# Patient Record
Sex: Male | Born: 2000 | Race: White | Hispanic: No | Marital: Single | Smoking: Never smoker
Health system: Southern US, Community
[De-identification: ages and names within clinical notes are randomized; demographics above are authoritative.]

## PROBLEM LIST (undated history)

## (undated) DIAGNOSIS — J302 Other seasonal allergic rhinitis: Secondary | ICD-10-CM

## (undated) HISTORY — DX: Other seasonal allergic rhinitis: J30.2

---

## 2021-02-10 ENCOUNTER — Other Ambulatory Visit: Payer: Self-pay

## 2021-02-10 ENCOUNTER — Other Ambulatory Visit: Payer: Self-pay | Admitting: Family Medicine

## 2021-02-10 ENCOUNTER — Ambulatory Visit
Admission: RE | Admit: 2021-02-10 | Discharge: 2021-02-10 | Disposition: A | Discharge status: Home / Self Care | Payer: BC Managed Care – PPO | Source: Ambulatory Visit | Attending: Family Medicine | Admitting: Family Medicine

## 2021-02-10 DIAGNOSIS — R0602 Shortness of breath: Secondary | ICD-10-CM

## 2021-02-10 DIAGNOSIS — R059 Cough, unspecified: Secondary | ICD-10-CM | POA: Diagnosis present

## 2022-09-09 IMAGING — CR DG CHEST 2V
1 series · 2 of 2 positions shown · non-contrast
Comparison: None.

CLINICAL DATA: Cough, shortness of breath

EXAM:
CHEST - 2 VIEW

[Series 1: dg chest 2 view · 0.14mm/px · 2 of 2 slices shown]
[im 1/2]
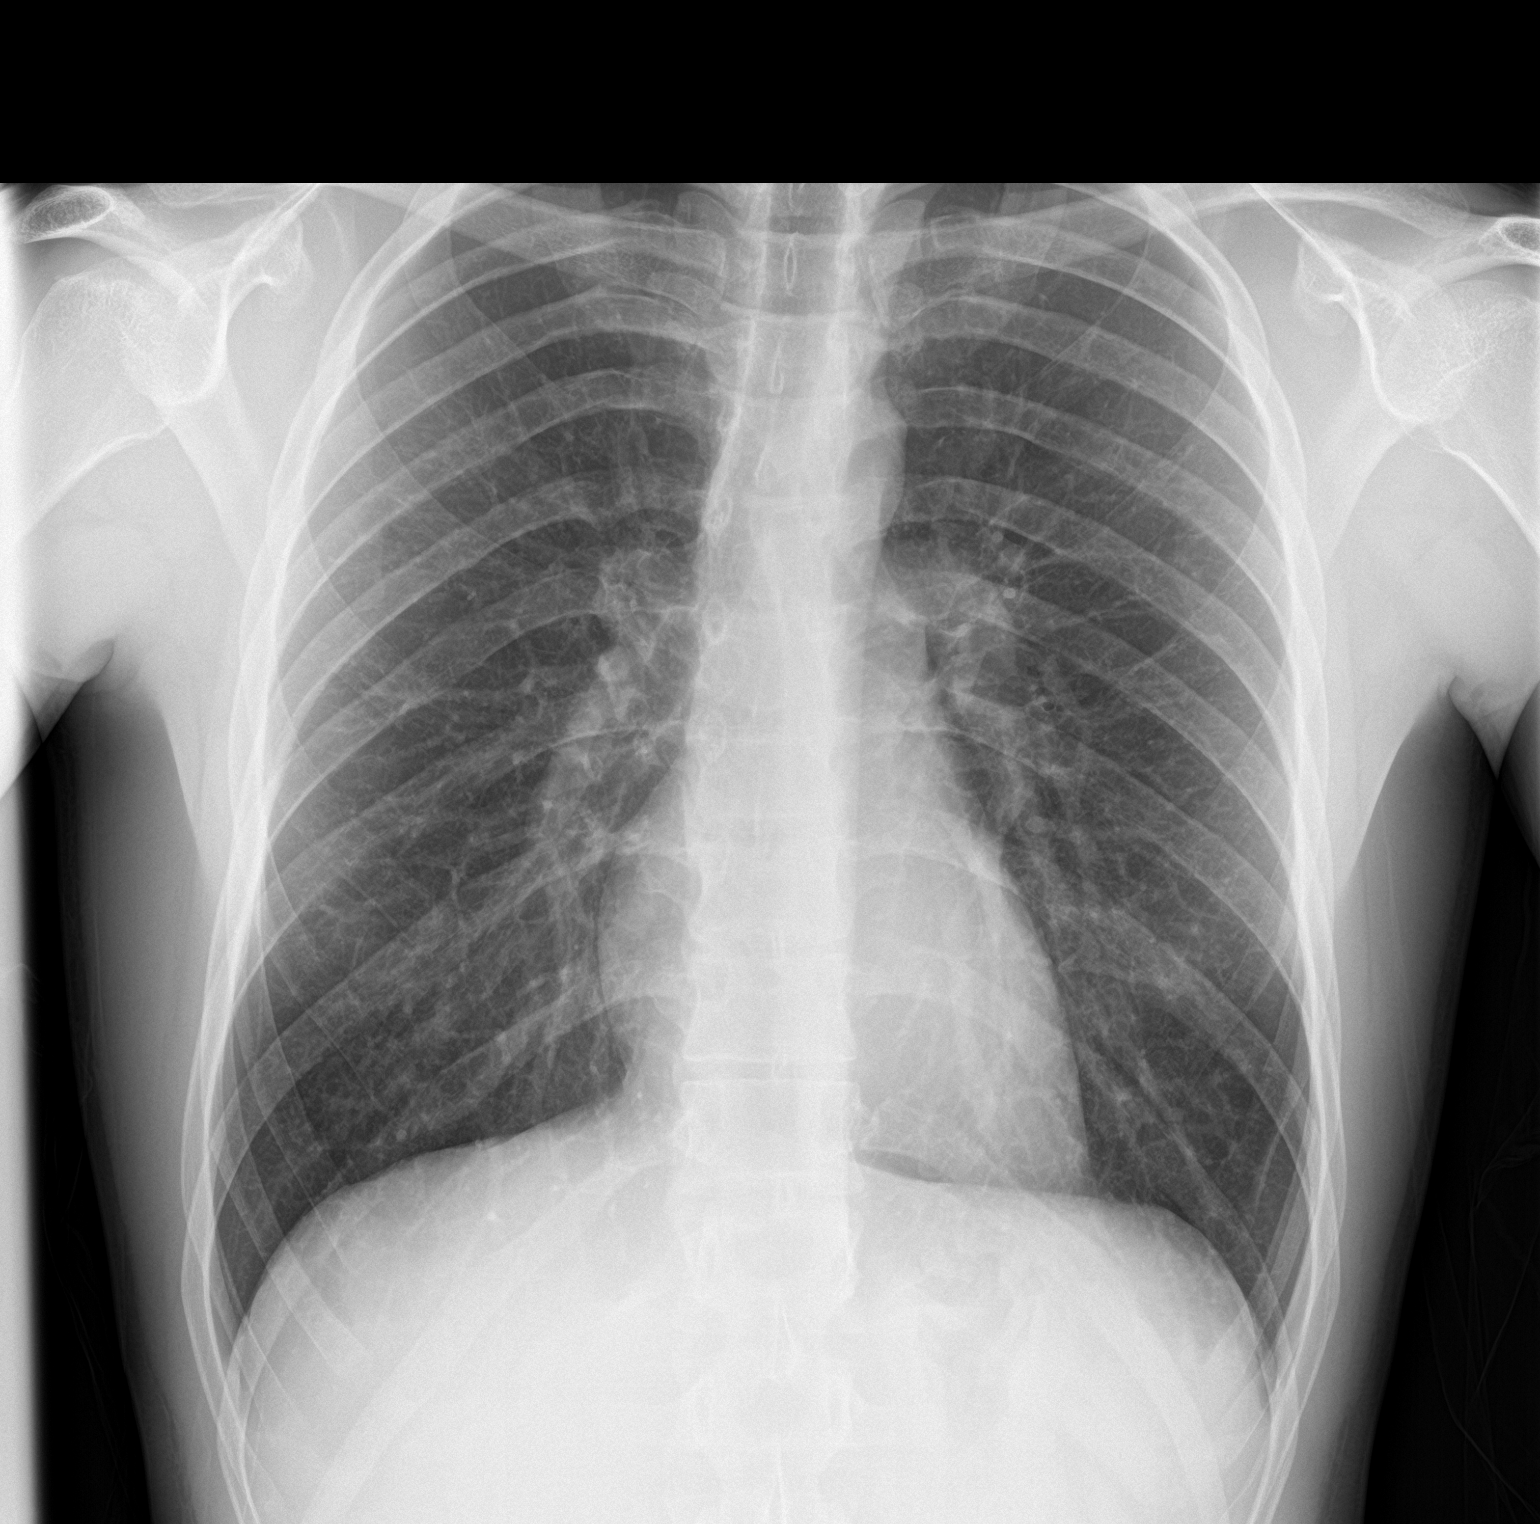
[im 2/2]
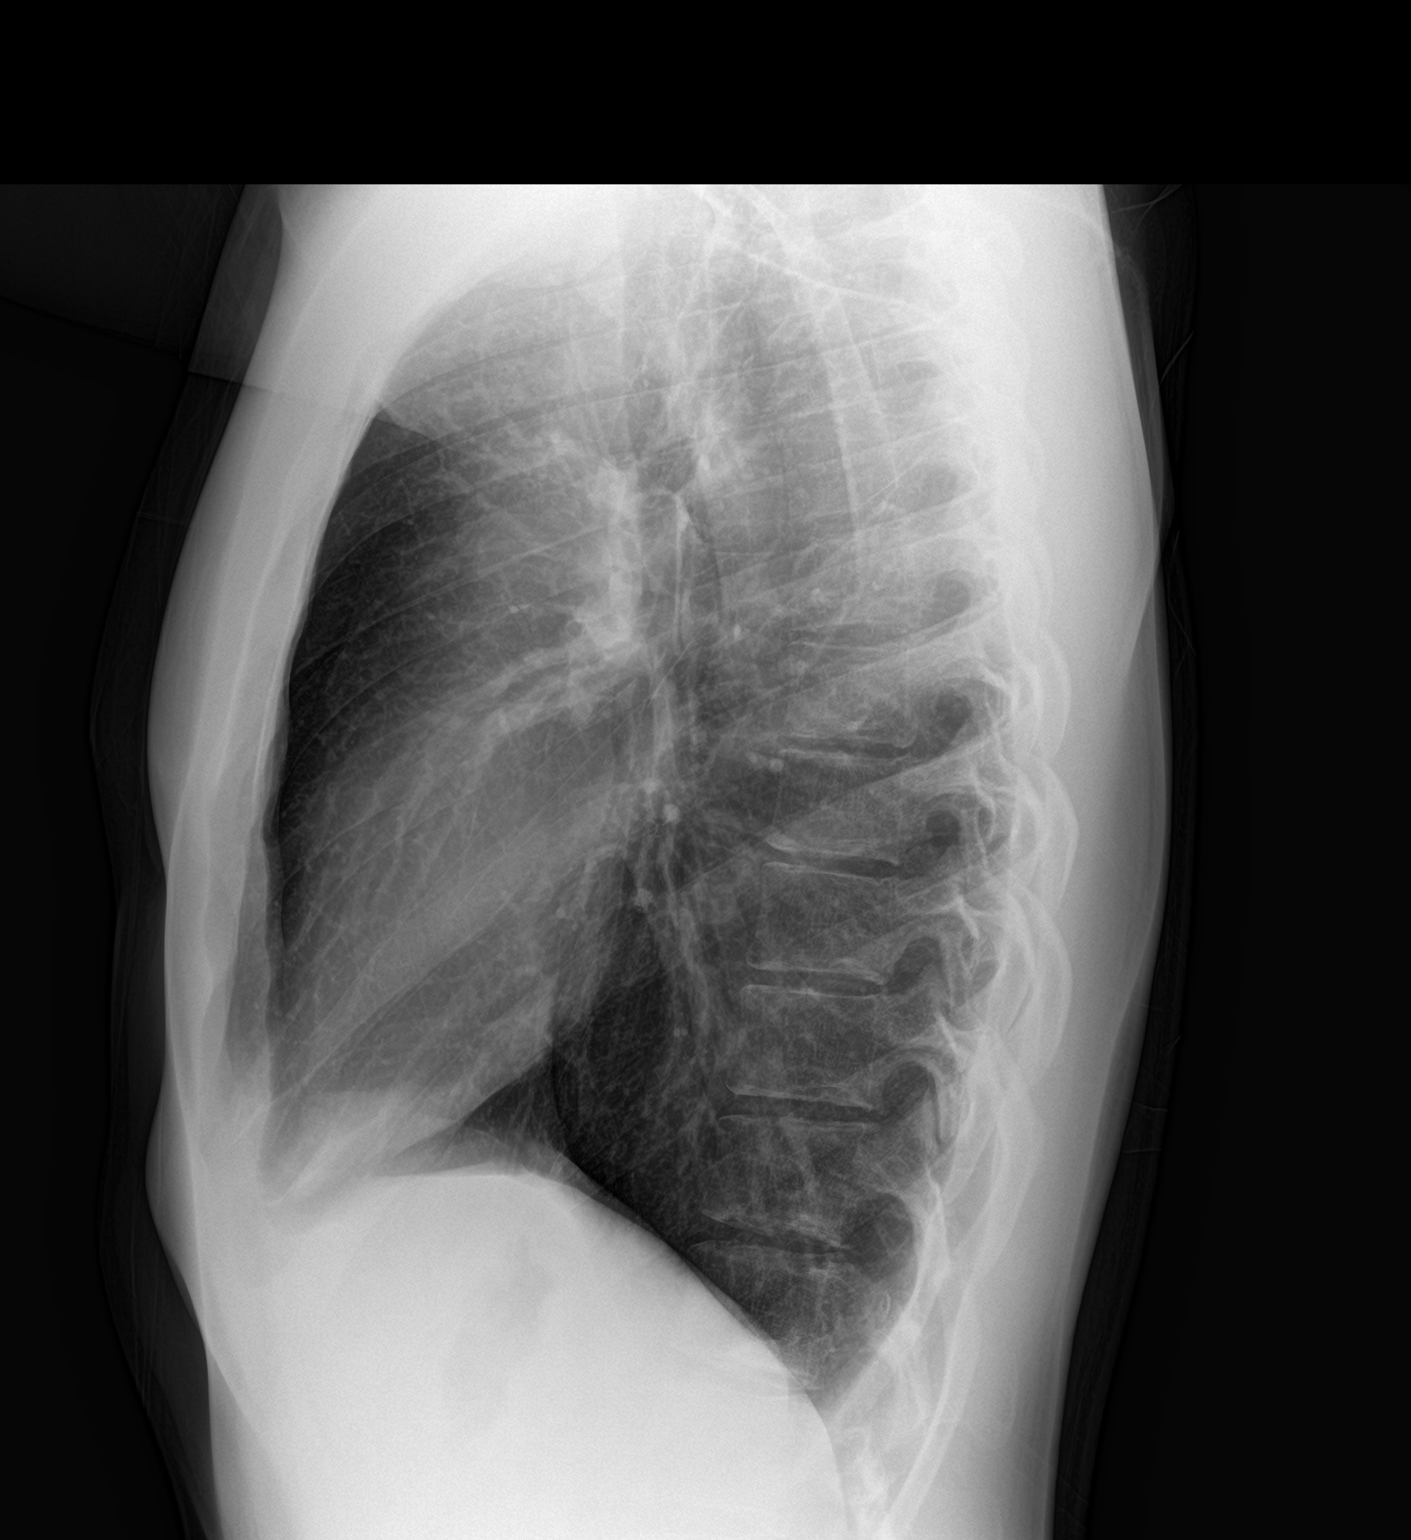

[2 of 2 positions shown; findings below may reference images not displayed]

FINDINGS: Slight hyperinflation. No consolidation, features of edema,
pneumothorax, or effusion. Pulmonary vascularity is normally
distributed. The cardiomediastinal contours are unremarkable. No
acute osseous or soft tissue abnormality.
IMPRESSION: Mild hyperinflation without other acute cardiopulmonary abnormality.

## 2023-02-08 ENCOUNTER — Other Ambulatory Visit: Payer: Self-pay

## 2023-02-08 ENCOUNTER — Encounter: Payer: Self-pay | Admitting: Medical

## 2023-02-08 ENCOUNTER — Ambulatory Visit (INDEPENDENT_AMBULATORY_CARE_PROVIDER_SITE_OTHER): Payer: BC Managed Care – PPO | Admitting: Medical

## 2023-02-08 VITALS — BP 140/72 | HR 81 | Temp 95.0°F | Ht 72.05 in | Wt 186.0 lb

## 2023-02-08 DIAGNOSIS — R052 Subacute cough: Secondary | ICD-10-CM

## 2023-02-08 DIAGNOSIS — J329 Chronic sinusitis, unspecified: Secondary | ICD-10-CM | POA: Diagnosis not present

## 2023-02-08 DIAGNOSIS — J302 Other seasonal allergic rhinitis: Secondary | ICD-10-CM

## 2023-02-08 MED ORDER — AZELASTINE HCL 0.1 % NA SOLN: 1.0000 | Freq: Two times a day (BID) | NASAL | 0 refills | Status: DC

## 2023-02-08 MED ORDER — AMOXICILLIN-POT CLAVULANATE 875-125 MG PO TABS: 1.0000 | ORAL_TABLET | Freq: Two times a day (BID) | ORAL | 0 refills | Status: DC

## 2023-02-08 MED ORDER — ALBUTEROL SULFATE HFA 108 (90 BASE) MCG/ACT IN AERS: 1.0000 | INHALATION_SPRAY | RESPIRATORY_TRACT | 0 refills | Status: DC | PRN

## 2023-02-08 NOTE — Patient Instructions (Addendum)
-  Start Azelastine nasal spray, 1 spray to each nostril twice daily. -Continue Flonase/Fluticasone nasal spray, 2 sprays to each nostril once a day. -Continue Xyzal once daily. -Use albuterol inhaler if needed for shortness of breath, wheezing, chest tightness or persistent cough. -Rest and stay well hydrated (by drinking water and other liquids).  -Continue Sudafed or Mucinex DM if needed. -Fill antibiotic to hold for now. May stay this if symptoms worsen (i.e. persistent sinus pain, fever) -Send MyChart message to provider or schedule return visit as needed for new/worsening symptoms or if symptoms do not improve as discussed with recommended treatment.   -Schedule appointment with ENT for June to discuss frequent sinus infections.

## 2023-02-08 NOTE — Progress Notes (Signed)
[place] [place], [place] [address] Phone: [phone] Fax: [phone]   Office Visit Note  Patient Name: Parker Jones  Date of [id number]  Med Rec number [id number]  Date of Service: 02/08/2023  Allergies: Patient has no known allergies.  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   Has been on 3 antibiotics since returned in January for sinusitis/bronchitis. Was in [place] for fall semester. Most recent antibiotic was a Zpack, was prescribed about a month ago. Had only minor improvement with this. Tends to get sinus infections in spring since coming to [facility] for school, occasionally has sinusitis other times of year. Started Xyzal last week. Has been using Navage twice a day since January using purified water. D/C using Navage has been green last 2 weeks. Has used Flonase since beginning of January. Has not seen ENT recently. Has not had allergy testing.   Has used Mucinex DM this week, Sudafed last week, some helpful.   Has had cough last 2 weeks, productive of green mucus. States it is not unusual for him to have a lot of mucus. It is generally a little hard to take a deep breath. Nasal congestion for at least last 2 weeks. Still has mostly green nasal d/c. Currently does not have sinus pain, but did have this up until this past weekend. Sometimes has HA, not currently  Does sleep well. Cough is not keeping him up. Some chills last week, not for over a week.   Going to [facility] for spring break. Smoking marijuana, less in last 2 weeks. No vaping.    Current Medication:  Outpatient Encounter Medications as of 02/08/2023  Medication Sig   fluticasone (FLONASE) 50 MCG/ACT nasal spray 1 spray in each nostril Nasally Twice a day for 30 day(s)   levocetirizine (XYZAL) 5 MG tablet Take 5 mg by mouth every evening.   No facility-administered encounter medications on file as of  02/08/2023.      Medical History: Seasonal allergies Recurrent sinus infections   Vital Signs: BP (!) 140/72   Pulse 81   Temp (!) 95 F (35 C) (Tympanic)   Ht 6' 0.05" (1.83 m)   Wt 186 lb (84.4 kg)   SpO2 98%   BMI 25.19 kg/m    Review of Systems See HPI  Physical Exam Vitals reviewed.  Constitutional:      General: He is not in acute distress.    Appearance: He is not ill-appearing.  HENT:     Head: Normocephalic.     Right Ear: External ear normal.     Left Ear: External ear normal.     Ears:     Comments: Limited view of TMs due to dry cerumen to EACs. Visible portion of TMs not erythematous.    Nose: Mucosal edema (R>L), congestion and rhinorrhea present. Rhinorrhea is clear.     Right Turbinates: Swollen.     Left Turbinates: Swollen.     Right Sinus: No maxillary sinus tenderness or frontal sinus tenderness.     Left Sinus: No maxillary sinus tenderness or frontal sinus tenderness.     Mouth/Throat:     Mouth: Mucous membranes are moist. No oral lesions.     Pharynx: No pharyngeal swelling or posterior oropharyngeal erythema.     Tonsils: No tonsillar exudate. 0 on the right. 0 on the left.  Cardiovascular:     Rate and Rhythm:  Normal rate and regular rhythm.     Heart sounds: No murmur heard.    No friction rub. No gallop.  Pulmonary:     Effort: Pulmonary effort is normal.     Breath sounds: Normal breath sounds. No wheezing, rhonchi or rales.     Comments: Breath sounds slightly distant/diminished in bases bilaterally. Musculoskeletal:     Cervical back: Neck supple. No rigidity.  Lymphadenopathy:     Cervical: Cervical adenopathy (1+ anterior nodes, mildly tender) present.  Neurological:     Mental Status: He is alert.     Assessment/Plan: 1. Recurrent sinusitis 2. Subacute cough 3. Seasonal allergic rhinitis, unspecified trigger Suspect inadequately managed allergies likely contributing to recurrent sinusitis. Will start Azelastine nasal  spray to use in addition to Flonase and Xyzal. Continue Navage twice daily. May continue decongestant as needed. Given upcoming travel for spring break, will give antibiotic to hold if develops new/worsening symptoms (i.e. persistent sinus pain/pressure, fever). Gave albuterol inhaler to use as needed, has been helpful in past. Patient encouraged to schedule appointment with ENT for return home in June to discuss frequent sinus infections. Send MyChart message to provider or schedule return visit in meantime as needed for new/worsening symptoms (i.e. fever, increased shortness of breath, chest pain).  - albuterol (VENTOLIN HFA) 108 (90 Base) MCG/ACT inhaler; Inhale 1-2 puffs into the lungs every 4 (four) hours as needed for wheezing or shortness of breath (or cough).  Dispense: 1 each; Refill: 0 - amoxicillin-clavulanate (AUGMENTIN) 875-125 MG tablet; Take 1 tablet by mouth 2 (two) times daily.  Dispense: 20 tablet; Refill: 0 - azelastine (ASTELIN) 0.1 % nasal spray; Place 1 spray into both nostrils 2 (two) times daily. Use in each nostril as directed  Dispense: 30 mL; Refill: 0     General Counseling: Bettie verbalizes understanding of the findings of todays visit and agrees with plan of treatment. he has been encouraged to call the office with any questions or concerns that should arise related to todays visit.    Time spent:20 [place] PA-C [place] 02/08/2023 8:36 AM

## 2023-02-21 ENCOUNTER — Encounter: Payer: Self-pay | Admitting: Medical

## 2023-02-21 ENCOUNTER — Other Ambulatory Visit: Payer: Self-pay

## 2023-02-21 ENCOUNTER — Ambulatory Visit (INDEPENDENT_AMBULATORY_CARE_PROVIDER_SITE_OTHER): Payer: BC Managed Care – PPO | Admitting: Medical

## 2023-02-21 VITALS — BP 121/81 | HR 90 | Temp 95.7°F | Ht 72.05 in | Wt 186.0 lb

## 2023-02-21 DIAGNOSIS — Z7184 Encounter for health counseling related to travel: Secondary | ICD-10-CM | POA: Diagnosis not present

## 2023-02-21 DIAGNOSIS — Z029 Encounter for administrative examinations, unspecified: Secondary | ICD-10-CM

## 2023-02-21 MED ORDER — TYPHOID VACCINE PO CPDR: 1.0000 | DELAYED_RELEASE_CAPSULE | ORAL | 0 refills | Status: DC

## 2023-02-21 NOTE — Progress Notes (Unsigned)
[place] [place], [place] [address] Phone: [phone] Fax: [phone]   Office Visit Note  Patient Name: Parker Jones  Date of [id number]  Med Rec number [id number]  Date of Service: 02/21/2023  Allergies: Patient has no known allergies.  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 Has studied abroad in [place] and [place] before.  Last Tdap on file was 10/2012. Did receive 2 vaccines at last physical, unsure what they were.   Has seasonal allergies. Takes Xyzal, Flonase and Astelin. Does not have asthma, but uses albuterol about once a day during allergy season.  Denies hospitalizations or surgeries. Denies hx of serious injuries.    Current Medication:  Outpatient Encounter Medications as of 02/21/2023  Medication Sig   albuterol (VENTOLIN HFA) 108 (90 Base) MCG/ACT inhaler Inhale 1-2 puffs into the lungs every 4 (four) hours as needed for wheezing or shortness of breath (or cough).   azelastine (ASTELIN) 0.1 % nasal spray Place 1 spray into both nostrils 2 (two) times daily. Use in each nostril as directed   fluticasone (FLONASE) 50 MCG/ACT nasal spray 1 spray in each nostril Nasally Twice a day for 30 day(s)   levocetirizine (XYZAL) 5 MG tablet Take 5 mg by mouth every evening.   [DISCONTINUED] amoxicillin-clavulanate (AUGMENTIN) 875-125 MG tablet Take 1 tablet by mouth 2 (two) times daily. (Patient not taking: Reported on 02/21/2023)   No facility-administered encounter medications on file as of 02/21/2023.      Medical History: Past Medical History:  Diagnosis Date   Seasonal allergies      Vital Signs: BP 121/81   Pulse 90   Temp (!) 95.7 F (35.4 C) (Tympanic)   Ht 6' 0.05" (1.83 m)   Wt 186 lb (84.4 kg)   SpO2 99%   BMI 25.19 kg/m    Review of Systems  Physical Exam Vitals reviewed.   Constitutional:      General: He is not in acute distress.    Appearance: He is not ill-appearing.  HENT:     Head: Normocephalic and atraumatic.     Right Ear: External ear normal.     Left Ear: External ear normal.     Ears:     Comments: Moderate dry cerumen to EACs bilaterally. Visible portion of TMs normal.    Nose: No congestion or rhinorrhea.     Mouth/Throat:     Mouth: Mucous membranes are moist. No oral lesions.     Dentition: Normal dentition.     Pharynx: No pharyngeal swelling or posterior oropharyngeal erythema.     Tonsils: No tonsillar exudate. 0 on the right. 0 on the left.  Eyes:     General: Lids are normal.     Conjunctiva/sclera: Conjunctivae normal.     Pupils: Pupils are equal, round, and reactive to light.  Neck:     Thyroid: No thyroid mass, thyromegaly or thyroid tenderness.  Cardiovascular:     Rate and Rhythm: Normal rate and regular rhythm.     Heart sounds: No murmur heard.    No friction rub. No gallop.  Pulmonary:     Effort: Pulmonary effort is normal.     Breath sounds: Normal breath sounds. No wheezing, rhonchi or rales.  Abdominal:     General: There is no distension.  Palpations: Abdomen is soft. There is no mass.     Tenderness: There is no abdominal tenderness. There is no guarding.  Musculoskeletal:        General: No deformity or signs of injury.     Cervical back: Neck supple. No rigidity.     Right lower leg: No edema.     Left lower leg: No edema.     Comments: AROM grossly intact, symmetric and painless to bilateral UE, LE, lumbar and cervical spine  Lymphadenopathy:     Cervical: No cervical adenopathy.  Neurological:     Mental Status: He is alert and oriented to person, place, and time.     Cranial Nerves: Cranial nerves 2-12 are intact.     Gait: Gait and tandem walk normal.  Psychiatric:        Attention and Perception: Attention normal.        Mood and Affect: Mood and affect normal.        Behavior: Behavior  normal. Behavior is cooperative.       Assessment/Plan: 1. Encounter for administrative examinations, unspecified *** - typhoid (VIVOTIF) DR capsule; Take 1 capsule by mouth every other day.  Dispense: 4 capsule; Refill: 0  2. Travel advice encounter *** - typhoid (VIVOTIF) DR capsule; Take 1 capsule by mouth every other day.  Dispense: 4 capsule; Refill: 0  Patient will return later in week for Tdap, short on time today. Will return form to him at that time.  [facility] [address] for Typhoid rx  General Counseling: Parker Jones verbalizes understanding of the findings of todays visit and agrees with plan of treatment. he has been encouraged to call the office with any questions or concerns that should arise related to todays visit.   No orders of the defined types were placed in this encounter.   No orders of the defined types were placed in this encounter.   Time spent:*** Minutes    Billey Gosling PA-C [place] 02/21/2023 9:10 AM

## 2023-02-23 ENCOUNTER — Encounter: Payer: Self-pay | Admitting: Medical

## 2023-02-23 NOTE — Patient Instructions (Signed)
-  I recommend you stop smoking marijuana.  This should help improve your lung function, which will in turn make it easier to fully participate in your trip. -Make sure to bring sufficient supplies of your Xyzal, Flonase and azelastine with you on your trip. -Bring a full albuterol inhaler on your trip to use as needed while you are hiking. -Return later this week for a tetanus vaccine.

## 2023-02-24 ENCOUNTER — Ambulatory Visit (INDEPENDENT_AMBULATORY_CARE_PROVIDER_SITE_OTHER): Payer: BC Managed Care – PPO | Admitting: Medical

## 2023-02-24 ENCOUNTER — Other Ambulatory Visit: Payer: Self-pay

## 2023-02-24 ENCOUNTER — Encounter: Payer: Self-pay | Admitting: Medical

## 2023-02-24 VITALS — BP 103/68 | HR 88 | Temp 96.4°F | Wt 186.0 lb

## 2023-02-24 DIAGNOSIS — Z23 Encounter for immunization: Secondary | ICD-10-CM

## 2023-02-24 DIAGNOSIS — Z7184 Encounter for health counseling related to travel: Secondary | ICD-10-CM

## 2023-02-24 NOTE — Progress Notes (Signed)
[name] [place], [place] [address] Phone: [phone] Fax: [phone]   Office Visit Note  Patient Name: [place] Jones  Date of [id number]  Med Rec number [id number]  Date of Service: 02/24/2023  Allergies: Patient has no known allergies.  Chief Complaint  Patient presents with   Follow-up     HPI 22 YO male presents for follow up from recent physical and for vaccine.  See previous note. Here today for tetanus booster, prefers Td over Tdap.   Need additional form ("Lifesaving Medication Form") from patient to complete since he is using albuterol inhaler. He denies previous asthma dx but has experienced cough, wheezing and some shortness of breath responsive to albuterol during spring allergy season last few years. Denies having these symptoms in childhood or requiring an inhaler.  He does smoke marijuana regularly.   Has not yet picked up Typhoid vaccine from pharmacy.   Current Medication:  Outpatient Encounter Medications as of 02/24/2023  Medication Sig   albuterol (VENTOLIN HFA) 108 (90 Base) MCG/ACT inhaler Inhale 1-2 puffs into the lungs every 4 (four) hours as needed for wheezing or shortness of breath (or cough).   azelastine (ASTELIN) 0.1 % nasal spray Place 1 spray into both nostrils 2 (two) times daily. Use in each nostril as directed   fluticasone (FLONASE) 50 MCG/ACT nasal spray 1 spray in each nostril Nasally Twice a day for 30 day(s)   levocetirizine (XYZAL) 5 MG tablet Take 5 mg by mouth every evening.   typhoid (VIVOTIF) DR capsule Take 1 capsule by mouth every other day.   No facility-administered encounter medications on file as of 02/24/2023.      Medical History: Past Medical History:  Diagnosis Date   Seasonal allergies      Vital Signs: BP 103/68   Pulse 88   Temp (!) 96.4 F (35.8 C) (Tympanic)   Wt 186 lb (84.4 kg)   SpO2 98%   BMI 25.19 kg/m    Review of Systems  Constitutional: Negative.      Physical Exam Vitals reviewed.  Constitutional:      General: He is not in acute distress.    Appearance: He is not ill-appearing.  Neurological:     Mental Status: He is alert.       Assessment/Plan: 1. Need for prophylactic vaccination with tetanus-diphtheria (Td) Td booster administered today.   - Td : Tetanus/diphtheria >7yo Preservative  free  2. Travel advice encounter Patient will email me additional form for completion. Reiterated that he will need to bring sufficient supplies or Azelastine, Flonase, Xyzal and albuterol with him on trip. Discussed that smoking marijuana is likely contributing to cough/shortness of breath and that stopping would likely allow him to enjoy trip (which is very physically-demanding) more fully. Patient encouraged to call or go to pharmacy to see if Typhoid vaccine is ready. Needs to be completed at least 2 weeks before departure to ensure immunity benefit.   Once I receive additional form, I will complete it and leave paperwork for him to pick up from front desk. Will notify him via MyChart when form is ready.     General Counseling: juarez spaziani understanding of the findings of todays visit and agrees with plan of treatment. he has been encouraged to call the office with any questions or concerns that should arise related to todays visit.   Orders Placed This Encounter  Procedures   Td : Tetanus/diphtheria >7yo Preservative  free  Time spent:15 Lan[place] PA-C [facility] 02/24/2023 8:48 AM

## 2023-02-24 NOTE — Patient Instructions (Signed)
-  Please email me the "Lifesaving Medication Form" so I can complete it. -I will then send you a MyChart message when the form is ready to be picked up from the front desk.

## 2023-07-29 ENCOUNTER — Encounter: Payer: Self-pay | Admitting: Medical

## 2023-07-29 ENCOUNTER — Ambulatory Visit (INDEPENDENT_AMBULATORY_CARE_PROVIDER_SITE_OTHER): Payer: BC Managed Care – PPO | Admitting: Medical

## 2023-07-29 ENCOUNTER — Other Ambulatory Visit: Payer: Self-pay

## 2023-07-29 VITALS — BP 108/71 | HR 89 | Temp 95.6°F | Ht 72.84 in | Wt 189.0 lb

## 2023-07-29 DIAGNOSIS — Z8709 Personal history of other diseases of the respiratory system: Secondary | ICD-10-CM

## 2023-07-29 DIAGNOSIS — J309 Allergic rhinitis, unspecified: Secondary | ICD-10-CM

## 2023-07-29 DIAGNOSIS — R0981 Nasal congestion: Secondary | ICD-10-CM | POA: Diagnosis not present

## 2023-07-29 LAB — POC SOFIA 2 FLU + SARS ANTIGEN FIA
Influenza A, POC: NEGATIVE
Influenza B, POC: NEGATIVE
SARS Coronavirus 2 Ag: NEGATIVE

## 2023-07-29 MED ORDER — AZELASTINE HCL 0.1 % NA SOLN: 1.0000 | Freq: Two times a day (BID) | NASAL | 2 refills | Status: DC

## 2023-07-29 MED ORDER — ALBUTEROL SULFATE HFA 108 (90 BASE) MCG/ACT IN AERS
1.0000 | INHALATION_SPRAY | RESPIRATORY_TRACT | 1 refills | Status: AC | PRN
Start: 2023-07-29 — End: ?

## 2023-07-29 NOTE — Progress Notes (Unsigned)
[facility] [address] [place], [place] [address] Phone: [phone] Fax: [phone]   Office Visit Note  Patient Name: [place] Jones  Date of Birth:[birth year]  Med Rec number [id number]  Date of Service: 07/29/2023  Allergies: Patient has no known allergies.  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  Has hx of sinus infections/bronchitis recurrently, mostly in spring. Has had some improvement with using Navage, Flonase and Azelastine. Had used Xyzal, not on this currently. Started using Navage and Flonase 2 days ago.  Nasal congestion, sore throat, mild sinus pressure and some green mucus noted about 4 days ago. Had some chills earlier in week. No fever or sweats. No cough so far. No HA or myalgias.  Also using some OTC cold meds as needed last couple days.   Uses albuterol inhaler occasionally when feels out of breath. Used this some while hiking in [place] over summer. Did not have asthma in childhood.  Has had some sick contacts recently, none with COVID he knows of.   Current Medication:  Outpatient Encounter Medications as of 07/29/2023  Medication Sig   albuterol (VENTOLIN HFA) 108 (90 Base) MCG/ACT inhaler Inhale 1-2 puffs into the lungs every 4 (four) hours as needed for wheezing or shortness of breath (or cough).   azelastine (ASTELIN) 0.1 % nasal spray Place 1 spray into both nostrils 2 (two) times daily. Use in each nostril as directed   fluticasone (FLONASE) 50 MCG/ACT nasal spray 1 spray in each nostril Nasally Twice a day for 30 day(s)   levocetirizine (XYZAL) 5 MG tablet Take 5 mg by mouth every evening.   [DISCONTINUED] typhoid (VIVOTIF) DR capsule Take 1 capsule by mouth every other day. (Patient not taking: Reported on 07/29/2023)   No facility-administered encounter medications on file as of 07/29/2023.      Medical History: Past  Medical History:  Diagnosis Date   Seasonal allergies      Vital Signs: BP 108/71   Pulse 89   Temp (!) 95.6 F (35.3 C) (Tympanic)   Ht 6' 0.84" (1.85 m)   Wt 189 lb (85.7 kg)   SpO2 98%   BMI 25.05 kg/m    Review of Systems See HPI  Physical Exam Vitals reviewed.  Constitutional:      General: He is not in acute distress.    Appearance: He is not ill-appearing.  HENT:     Head: Normocephalic.     Right Ear: Tympanic membrane, ear canal and external ear normal.     Left Ear: Tympanic membrane, ear canal and external ear normal.     Nose: Mucosal edema and congestion present. No rhinorrhea.     Right Turbinates: Swollen.     Left Turbinates: Swollen.     Right Sinus: No maxillary sinus tenderness or frontal sinus tenderness.     Left Sinus: No maxillary sinus tenderness or frontal sinus tenderness.     Mouth/Throat:     Mouth: Mucous membranes are moist. No oral lesions.     Pharynx: Posterior oropharyngeal erythema (mild) present. No pharyngeal swelling.     Tonsils: No tonsillar exudate. 1+ on the right. 1+ on the left.  Cardiovascular:     Rate and Rhythm: Normal rate and regular rhythm.     Heart sounds: No murmur heard.    No friction rub. No gallop.  Pulmonary:  Effort: Pulmonary effort is normal.     Breath sounds: Normal breath sounds. No wheezing, rhonchi or rales.  Musculoskeletal:     Cervical back: Neck supple. No rigidity.  Lymphadenopathy:     Cervical: Cervical adenopathy (small anterior nodes) present.  Neurological:     Mental Status: He is alert.     Results for orders placed or performed in visit on 07/29/23 (from the past 24 hour(s))  POC SOFIA 2 FLU + SARS ANTIGEN FIA     Status: Normal   Collection Time: 07/29/23 11:05 AM  Result Value Ref Range   Influenza A, POC Negative Negative   Influenza B, POC Negative Negative   SARS Coronavirus 2 Ag Negative Negative     Assessment/Plan: 1. Nasal congestion 2. Allergic rhinitis,  unspecified seasonality, unspecified trigger 3. History of bronchitis Most likely viral infection in addition to seasonal allergies. COVID-19 and flu antigen tests negative. Advised to continue Navage, Flonase and Astelin. May start Xyzal if needed. Albuterol inhaler refilled to use PRN.   - POC SOFIA 2 FLU + SARS ANTIGEN FIA - azelastine (ASTELIN) 0.1 % nasal spray; Place 1 spray into both nostrils 2 (two) times daily. Use in each nostril as directed  Dispense: 30 mL; Refill: 2 - albuterol (VENTOLIN HFA) 108 (90 Base) MCG/ACT inhaler; Inhale 1-2 puffs into the lungs every 4 (four) hours as needed for wheezing or shortness of breath (or cough).  Dispense: 8.5 each; Refill: 1  Patient Instructions  -Rest and stay well hydrated (by drinking water and other liquids).  -Take over-the-counter medicines (i.e. Sudafed, Ibuprofen) to help relieve your symptoms. -Continue Navage, Flonase and Astelin. -For your sore throat, use cough drops/throat lozenges, gargle warm salt water and/or drink warm liquids (like tea with honey). -Send MyChart message to provider or schedule return visit as needed for new/worsening symptoms or if symptoms do not improve as discussed with recommended treatment over next 5-7 days.      General Counseling: [place] Jones understanding of the findings of todays visit and agrees with plan of treatment. he has been encouraged to call the office with any questions or concerns that should arise related to todays visit.   Time spent:20 Minutes    Jonathon Resides PA-C [facility] 07/29/2023 10:19 AM

## 2023-07-31 DIAGNOSIS — J019 Acute sinusitis, unspecified: Secondary | ICD-10-CM

## 2023-08-01 ENCOUNTER — Ambulatory Visit: Payer: BC Managed Care – PPO | Admitting: Medical

## 2023-08-01 MED ORDER — AMOXICILLIN-POT CLAVULANATE 875-125 MG PO TABS
1.0000 | ORAL_TABLET | Freq: Two times a day (BID) | ORAL | 0 refills | Status: AC
Start: 2023-08-01 — End: 2023-08-08

## 2023-08-04 ENCOUNTER — Encounter: Payer: Self-pay | Admitting: Medical

## 2023-08-04 NOTE — Patient Instructions (Signed)
-  Rest and stay well hydrated (by drinking water and other liquids).  -Take over-the-counter medicines (i.e. Sudafed, Ibuprofen) to help relieve your symptoms. -Continue Navage, Flonase and Astelin. -For your sore throat, use cough drops/throat lozenges, gargle warm salt water and/or drink warm liquids (like tea with honey). -Send MyChart message to provider or schedule return visit as needed for new/worsening symptoms or if symptoms do not improve as discussed with recommended treatment over next 5-7 days.

## 2023-10-04 ENCOUNTER — Encounter: Payer: Self-pay | Admitting: Medical

## 2023-10-04 ENCOUNTER — Ambulatory Visit (INDEPENDENT_AMBULATORY_CARE_PROVIDER_SITE_OTHER): Payer: BC Managed Care – PPO | Admitting: Medical

## 2023-10-04 VITALS — BP 134/72 | HR 81 | Temp 98.7°F | Resp 16 | Wt 189.0 lb

## 2023-10-04 DIAGNOSIS — Z7184 Encounter for health counseling related to travel: Secondary | ICD-10-CM

## 2023-10-04 DIAGNOSIS — R051 Acute cough: Secondary | ICD-10-CM | POA: Diagnosis not present

## 2023-10-04 DIAGNOSIS — J309 Allergic rhinitis, unspecified: Secondary | ICD-10-CM | POA: Diagnosis not present

## 2023-10-04 DIAGNOSIS — M791 Myalgia, unspecified site: Secondary | ICD-10-CM

## 2023-10-04 LAB — POC SOFIA 2 FLU + SARS ANTIGEN FIA
Influenza A, POC: NEGATIVE
Influenza B, POC: NEGATIVE
SARS Coronavirus 2 Ag: NEGATIVE

## 2023-10-04 MED ORDER — AZELASTINE HCL 0.1 % NA SOLN: 1.0000 | Freq: Two times a day (BID) | NASAL | 2 refills | Status: DC

## 2023-10-04 NOTE — Progress Notes (Signed)
[facility] [address] [place], [place] [address] Phone: [phone] Fax: [phone]   Office Visit Note  Patient Name: Parker Jones  Date of Birth:02-19-2001  Med Rec number [id number]  Date of Service: 10/04/2023  Allergies: Patient has no known allergies.  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  Sx began 3 days ago with HA. Next day, had body aches, chills, heat flashes and cough. Mild scratchy throat, not sore. Waking up with sweats. Appetite low, has been fatigued. No nasal congestion or runny nose. Some diarrhea, no vomiting or nausea. Cough sounds phlegmy, has not expectorated anything. No documented fever.   A friend sick with some similar sx. No specific exposures.  Has taken Tylenol, none today.   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 [place] be [facility].  Patient also requests refill of Azelastine nasal spray for his environmental allergies.  Current Medication:  Outpatient Encounter Medications as of 10/04/2023  Medication Sig   albuterol (VENTOLIN HFA) 108 (90 Base) MCG/ACT inhaler Inhale 1-2 puffs into the lungs every 4 (four) hours as needed for wheezing or shortness of breath (or cough).   azelastine (ASTELIN) 0.1 % nasal spray Place 1 spray into both nostrils 2 (two) times daily. Use in each nostril as directed   fluticasone (FLONASE) 50 MCG/ACT nasal spray 1 spray in each nostril Nasally Twice a day for 30 day(s)   levocetirizine (XYZAL) 5 MG tablet Take 5 mg by mouth every evening.   No facility-administered encounter medications on file as of 10/04/2023.      Medical History: Past Medical History:  Diagnosis Date   Seasonal allergies      Vital Signs: BP 134/72   Pulse 81   Temp 98.7 F (37.1 C) (Tympanic)   Resp 16   Wt 189 lb (85.7 kg)   SpO2 99%    BMI 25.05 kg/m    Review of Systems See HPI  Physical Exam Vitals reviewed.  Constitutional:      General: He is not in acute distress.    Appearance: He is not ill-appearing.     Comments: Tired appearing  HENT:     Head: Normocephalic.     Right Ear: Ear canal and external ear normal.     Left Ear: Ear canal and external ear normal.     Ears:     Comments: Moderate dry cerumen to EACs. Visible portion of TMs dull, not inflamed.    Nose: No mucosal edema, congestion or rhinorrhea.     Mouth/Throat:     Mouth: Mucous membranes are moist. No oral lesions.     Pharynx: Posterior oropharyngeal erythema (mild) present. No pharyngeal swelling.     Tonsils: No tonsillar exudate. 0 on the right. 0 on the left.  Cardiovascular:     Rate and Rhythm: Normal rate and regular rhythm.     Heart sounds: No murmur heard.    No friction rub. No gallop.  Pulmonary:     Effort: Pulmonary effort is normal.     Breath sounds: Normal breath sounds. No wheezing, rhonchi or rales.     Comments: Occasional wet-sounding cough Musculoskeletal:     Cervical back: Neck supple. No rigidity.  Lymphadenopathy:     Cervical: Cervical adenopathy (1+ anterior nodes, slightly tender) present.  Neurological:  Mental Status: He is alert.    Results for orders placed or performed in visit on 10/04/23 (from the past 24 hour(s))  POC SOFIA 2 FLU + SARS ANTIGEN FIA     Status: Normal   Collection Time: 10/04/23  9:10 AM  Result Value Ref Range   Influenza A, POC Negative Negative   Influenza B, POC Negative Negative   SARS Coronavirus 2 Ag Negative Negative      Assessment/Plan: 1. Acute cough 2. Myalgia - POC SOFIA 2 FLU + SARS ANTIGEN FIA  3. Allergic rhinitis, unspecified seasonality, unspecified trigger - azelastine (ASTELIN) 0.1 % nasal spray; Place 1 spray into both nostrils 2 (two) times daily. Use in each nostril as directed  Dispense: 30 mL; Refill: 2  Most likely viral infection.  Discussed supportive measures and OTC symptomatic treatment.  4. Travel Advice Encounter Reviewed CDC travel health recommendations for [place] with patient. He was previously vaccinated for Hepatitis A and B and typhoid. Also UTD on standard immunizations. Discussed possible Malaria prophylaxis for [place] - encouraged to discuss with trip leaders and contact me if he would like prescription. Also encouraged to get flu shot.     General Counseling: [email or web address] falke understanding of the findings of todays visit and agrees with plan of treatment. he has been encouraged to call the office with any questions or concerns that should arise related to todays visit.    Time spent:20 Minutes    Jonathon Resides PA-C [facility] 10/04/2023 8:31 AM

## 2023-10-10 NOTE — Patient Instructions (Addendum)
-  Rest and stay well hydrated (by drinking water and other liquids). Avoid/limit caffeine. -Take over-the-counter medicines (i.e. Mucinex DM, Nyquil, Ibuprofen) to help relieve your symptoms. -Continue Flonase/Fluticasone nasal spray, 2 sprays to each nostril once a day. -Continue Azelastine nasal spray, 1 sprays to each nostril twice a day.  -For your sore throat/cough, use cough drops/throat lozenges, gargle warm salt water and/or drink warm liquids (like tea with honey). -Send MyChart message to provider or schedule return visit as needed for new/worsening symptoms or if symptoms do not improve as discussed with recommended treatment over next 5-7 days.  Discuss the need for malaria prophylaxis based on your itinerary in [place] with your professor/J-term trip leader. If you would like a prescription for this, let me know.  Get your seasonal flu shot once you are feeling better.

## 2023-12-27 DIAGNOSIS — J309 Allergic rhinitis, unspecified: Secondary | ICD-10-CM

## 2023-12-27 MED ORDER — AZELASTINE HCL 0.1 % NA SOLN
1.0000 | Freq: Two times a day (BID) | NASAL | 3 refills | Status: DC
Start: 2023-12-27 — End: 2024-03-11

## 2024-03-11 DIAGNOSIS — J309 Allergic rhinitis, unspecified: Secondary | ICD-10-CM

## 2024-03-11 MED ORDER — AZELASTINE HCL 0.1 % NA SOLN: 1.0000 | Freq: Two times a day (BID) | NASAL | 3 refills | Status: AC

## 2024-03-14 ENCOUNTER — Ambulatory Visit (INDEPENDENT_AMBULATORY_CARE_PROVIDER_SITE_OTHER): Admitting: Medical

## 2024-03-14 ENCOUNTER — Encounter: Payer: Self-pay | Admitting: Medical

## 2024-03-14 ENCOUNTER — Other Ambulatory Visit: Payer: Self-pay

## 2024-03-14 VITALS — BP 123/84 | HR 69 | Temp 94.8°F | Ht 73.0 in | Wt 188.0 lb

## 2024-03-14 DIAGNOSIS — J302 Other seasonal allergic rhinitis: Secondary | ICD-10-CM

## 2024-03-14 DIAGNOSIS — J069 Acute upper respiratory infection, unspecified: Secondary | ICD-10-CM | POA: Diagnosis not present

## 2024-03-14 NOTE — Progress Notes (Signed)
 [facility] [address] [place], [place] [address] Phone: [phone] Fax: [phone]   Office Visit Note  Patient Name: Randee Coody  Date of Birth:04/23/2001  Med Rec number [id number]  Date of Service: 03/14/2024  Allergies: Patient has no known allergies.  Chief Complaint  Patient presents with   Acute Visit     HPI 23 y.o. college student presents with  sore throat.  Sore/scratchy throat began 4 nights ago. Noted some white spots to throat that day but has not noted this since then. Developed some nasal congestion and swollen lymph nodes in last couple days. Chest feels little congested, coughing voluntarily to clear this. No fever or chills. Mild fatigue. No HA. Some sinus pressure. Yellow nasal d/c, cough some productive of similar mucus.  Had been off Azelastine  for few days when ran out. Takes Flonase/Azelastine  and Xyzal daily for allergies. Allergies overall have not been bad this spring.  Has not shared drinks he knows of. Has not had mono. Some friends have mono.   Taking usual allergy meds. No other OTC meds.    Current Medication:  Outpatient Encounter Medications as of 03/14/2024  Medication Sig   albuterol  (VENTOLIN  HFA) 108 (90 Base) MCG/ACT inhaler Inhale 1-2 puffs into the lungs every 4 (four) hours as needed for wheezing or shortness of breath (or cough).   azelastine  (ASTELIN ) 0.1 % nasal spray Place 1 spray into both nostrils 2 (two) times daily. Use in each nostril as directed   fluticasone (FLONASE) 50 MCG/ACT nasal spray 1 spray in each nostril Nasally Twice a day for 30 day(s)   levocetirizine (XYZAL) 5 MG tablet Take 5 mg by mouth every evening.   No facility-administered encounter medications on file as of 03/14/2024.      Medical History: Past Medical History:  Diagnosis Date   Seasonal allergies      Vital Signs: BP 123/84   Pulse 69   Temp (!) 94.8 F (34.9 C) (Tympanic)   Ht 6\' 1"  (1.854 m)   Wt 188 lb (85.3 kg)    SpO2 99%   BMI 24.80 kg/m    Review of Systems See HPI  Physical Exam Vitals reviewed.  Constitutional:      General: He is not in acute distress.    Appearance: He is not ill-appearing.  HENT:     Head: Normocephalic.     Right Ear: Tympanic membrane, ear canal and external ear normal.     Left Ear: Tympanic membrane, ear canal and external ear normal.     Nose: Mucosal edema (mild) and congestion (mild) present. No rhinorrhea.     Right Turbinates: Swollen.     Left Turbinates: Swollen.     Right Sinus: No maxillary sinus tenderness or frontal sinus tenderness.     Left Sinus: No maxillary sinus tenderness or frontal sinus tenderness.     Mouth/Throat:     Mouth: Mucous membranes are moist. No oral lesions.     Pharynx: Posterior oropharyngeal erythema (mild) present. No pharyngeal swelling.     Tonsils: No tonsillar exudate. 0 on the right. 0 on the left.  Cardiovascular:     Rate and Rhythm: Normal rate and regular rhythm.     Heart sounds: No murmur heard.    No friction rub. No gallop.  Pulmonary:     Effort: Pulmonary effort is normal.     Breath sounds: Normal breath sounds. No wheezing, rhonchi or rales.  Musculoskeletal:     Cervical back:  Neck supple. No rigidity.  Lymphadenopathy:     Cervical: Cervical adenopathy (1+ anterior nodes, slightly tender) present.  Neurological:     Mental Status: He is alert.       Assessment/Plan: 1. Acute upper respiratory infection (Primary) 2. Seasonal allergic rhinitis, unspecified trigger   Suspect viral URI in addition to allergies. Patient declined POC COVID-19/flu antigen testing. Low suspicion for mono at this time based on clinical findings. May start Mucinex D, Robitussin. Continue allergy meds.  Patient Instructions  -Rest and stay well hydrated (by drinking water and other liquids).  -Take over-the-counter medicines (i.e. Mucinex D, Robitussin, Ibuprofen or Tylenol) to help relieve your symptoms. -Continue  nasal sprays and Xyzal. -For your sore throat/cough, use cough drops/throat lozenges, gargle warm salt water and/or drink warm liquids (like tea with honey). -Send MyChart message to provider or schedule return visit as needed for new/worsening symptoms (i.e. fever, worsening throat pain, shortness of breath or persistent sinus pain) or if symptoms do not improve as discussed with recommended treatment over next 5-7 days.   General Counseling: anwar baack understanding of the findings of todays plan of treatment. he has been encouraged to call the office with any questions or concerns that should arise related to todays visit.    Time spent:20 Minutes    Ponciano Bristle PA-C [facility] 03/14/2024 10:05 AM

## 2024-03-18 NOTE — Patient Instructions (Addendum)
-  Rest and stay well hydrated (by drinking water and other liquids).  -Take over-the-counter medicines (i.e. Mucinex D, Robitussin, Ibuprofen or Tylenol) to help relieve your symptoms. -Continue nasal sprays and Xyzal. -For your sore throat/cough, use cough drops/throat lozenges, gargle warm salt water and/or drink warm liquids (like tea with honey). -Send MyChart message to provider or schedule return visit as needed for new/worsening symptoms (i.e. fever, worsening throat pain, shortness of breath or persistent sinus pain) or if symptoms do not improve as discussed with recommended treatment over next 5-7 days.

## 2024-03-27 ENCOUNTER — Ambulatory Visit (INDEPENDENT_AMBULATORY_CARE_PROVIDER_SITE_OTHER): Admitting: Medical

## 2024-03-27 ENCOUNTER — Encounter: Payer: Self-pay | Admitting: Medical

## 2024-03-27 ENCOUNTER — Other Ambulatory Visit: Payer: Self-pay

## 2024-03-27 VITALS — BP 128/72 | HR 83 | Temp 95.7°F

## 2024-03-27 DIAGNOSIS — J069 Acute upper respiratory infection, unspecified: Secondary | ICD-10-CM | POA: Diagnosis not present

## 2024-03-27 DIAGNOSIS — R052 Subacute cough: Secondary | ICD-10-CM | POA: Diagnosis not present

## 2024-03-27 LAB — POC COVID19/FLU A&B COMBO
Covid Antigen, POC: NEGATIVE
Influenza A Antigen, POC: NEGATIVE
Influenza B Antigen, POC: NEGATIVE

## 2024-03-27 NOTE — Progress Notes (Signed)
 [facility] [address] [place], [place] [address] Phone: [phone] Fax: [phone]   Office Visit Note  Patient Name: Parker Jones  Date of Birth:May 17, 2001  Med Rec number [id number]  Date of Service: 03/27/2024  Allergies: Patient has no known allergies.  Chief Complaint  Patient presents with   Acute Visit     HPI 23 y.o. college student presents with respiratory sx.  Sx had continued after last visit, seemed to be improving. Felt much worse 5 days ago with body aches, chills, HA, no appetite and very fatigued. Has been congested but this has improved some. Cough worse, chest feels congested. Not really SOB. Sore throat improved since yesterday. Has little more energy today. Still sweats at night. No NV/D. Cough productive of yellow mucus.   Was in [place] over weekend, took it easy for most part.  Has been around folks with mono, has not shared anything he knows of.   Using albuterol  inhaler. Some Tylenol and Sudafed. Usual allergy meds. Using Navage BID, yellow mucus initially, now back to normal.   Feels pressure/clogged feeling to left maxillary area/temple in last week at times. Feels better on Tylenol, worse as it wears off.   Did a TeleHealth visit 5 days ago, was prescribed abx but never picked them up.  Current Medication:  Outpatient Encounter Medications as of 03/27/2024  Medication Sig   albuterol  (VENTOLIN  HFA) 108 (90 Base) MCG/ACT inhaler Inhale 1-2 puffs into the lungs every 4 (four) hours as needed for wheezing or shortness of breath (or cough).   azelastine  (ASTELIN ) 0.1 % nasal spray Place 1 spray into both nostrils 2 (two) times daily. Use in each nostril as directed   fluticasone (FLONASE) 50 MCG/ACT nasal spray 1 spray in each nostril Nasally Twice a day for 30 day(s)   levocetirizine (XYZAL) 5 MG tablet Take 5 mg by mouth every evening.   No facility-administered encounter medications on file as of 03/27/2024.      Medical  History: Past Medical History:  Diagnosis Date   Seasonal allergies      Vital Signs: BP 128/72   Pulse 83   Temp (!) 95.7 F (35.4 C) (Tympanic)   SpO2 99%    Review of Systems See HPI  Physical Exam Vitals reviewed.  Constitutional:      General: He is not in acute distress.    Comments: Tired appearing  HENT:     Head: Normocephalic.     Right Ear: Tympanic membrane, ear canal and external ear normal.     Left Ear: Tympanic membrane, ear canal and external ear normal.     Nose: Congestion present. No mucosal edema or rhinorrhea.     Right Turbinates: Swollen.     Left Turbinates: Swollen.     Comments: Some pressure with palpation over maxillary sinuses    Mouth/Throat:     Mouth: Mucous membranes are moist. No oral lesions.     Pharynx: No pharyngeal swelling or posterior oropharyngeal erythema.     Tonsils: No tonsillar exudate. 0 on the right. 0 on the left.  Cardiovascular:     Rate and Rhythm: Normal rate and regular rhythm.     Heart sounds: No murmur heard.    No friction rub. No gallop.  Pulmonary:     Effort: Pulmonary effort is normal. Prolonged expiration present. No respiratory distress.     Breath sounds: Normal breath sounds. No wheezing, rhonchi or rales.  Musculoskeletal:     Cervical  back: Neck supple. No rigidity.  Lymphadenopathy:     Cervical: Cervical adenopathy (small anterior nodes, nontender) present.  Neurological:     Mental Status: He is alert.     POC Covid19/Flu A&B Antigen     Status: Normal   Collection Time: 03/27/24 11:25 AM  Result Value Ref Range   Influenza A Antigen, POC Negative Negative   Influenza B Antigen, POC Negative Negative   Covid Antigen, POC Negative Negative     Assessment/Plan: 1. Acute upper respiratory infection (Primary) 2. Subacute cough  - CBC with Differential/Platelet - Epstein-Barr virus VCA, IgM - POC Covid19/Flu A&B Antigen   Will check labs to better characterize infection. Patient  will continue Astelin , Flonase and Xyzal. Continue Navage. May take Sudafed, Ibuprofen and Mucinex DM as needed for sx relief. Will follow up with results when available.  Patient Instructions  -Rest and stay well hydrated (by drinking water and other liquids). Avoid/limit caffeine. -Continue Azelastine , Flonase and Xyzal. -Use albuterol  inhaler if needed for shortness of breath, wheezing, chest tightness or persistent cough. -Take over-the-counter medicines (i.e.Sudafed, Mucinex DM, Ibuprofen) as needed to help relieve your symptoms. -For your sore throat/cough, use cough drops/throat lozenges, gargle warm salt water and/or drink warm liquids (like tea with honey).  -You will receive a MyChart message notifying you of your lab results when they are available.  -Send MyChart message to provider in meantime as needed for new/worsening symptoms.    General Counseling: [email or web address] cheuvront understanding of the findings of todays visit.  he has been encouraged to call the office with any questions or concerns that should arise related to todays visit.   Ponciano Bristle PA-C [facility] 03/27/2024 10:58 AM

## 2024-03-28 DIAGNOSIS — J019 Acute sinusitis, unspecified: Secondary | ICD-10-CM

## 2024-03-28 LAB — CBC WITH DIFFERENTIAL/PLATELET
Basophils Absolute: 0 10*3/uL (ref 0.0–0.2)
Basos: 0 %
EOS (ABSOLUTE): 0 10*3/uL (ref 0.0–0.4)
Eos: 1 %
Hematocrit: 45.9 % (ref 37.5–51.0)
Hemoglobin: 15.2 g/dL (ref 13.0–17.7)
Immature Grans (Abs): 0 10*3/uL (ref 0.0–0.1)
Immature Granulocytes: 0 %
Lymphocytes Absolute: 2 10*3/uL (ref 0.7–3.1)
Lymphs: 26 %
MCH: 30 pg (ref 26.6–33.0)
MCHC: 33.1 g/dL (ref 31.5–35.7)
MCV: 91 fL (ref 79–97)
Monocytes Absolute: 1.4 10*3/uL — ABNORMAL HIGH (ref 0.1–0.9)
Monocytes: 18 %
Neutrophils Absolute: 4.3 10*3/uL (ref 1.4–7.0)
Neutrophils: 55 %
Platelets: 222 10*3/uL (ref 150–450)
RBC: 5.07 x10E6/uL (ref 4.14–5.80)
RDW: 12.3 % (ref 11.6–15.4)
WBC: 7.7 10*3/uL (ref 3.4–10.8)

## 2024-03-28 LAB — EPSTEIN-BARR VIRUS VCA, IGM: EBV VCA IgM: <36 U/mL (ref 0.0–35.9)

## 2024-03-30 MED ORDER — PREDNISONE 20 MG PO TABS
40.0000 mg | ORAL_TABLET | Freq: Every day | ORAL | 0 refills | Status: AC
Start: 2024-03-30 — End: 2024-04-04

## 2024-04-21 NOTE — Patient Instructions (Addendum)
-  Rest and stay well hydrated (by drinking water and other liquids). Avoid/limit caffeine. -Continue Azelastine , Flonase and Xyzal. -Use albuterol  inhaler if needed for shortness of breath, wheezing, chest tightness or persistent cough. -Take over-the-counter medicines (i.e.Sudafed, Mucinex DM, Ibuprofen) as needed to help relieve your symptoms. -For your sore throat/cough, use cough drops/throat lozenges, gargle warm salt water and/or drink warm liquids (like tea with honey).  -You will receive a MyChart message notifying you of your lab results when they are available.  -Send MyChart message to provider in meantime as needed for new/worsening symptoms.
